# Patient Record
Sex: Female | Born: 1990 | Race: White | Hispanic: No | Marital: Single
Health system: Southern US, Community
[De-identification: ages and names within clinical notes are randomized; demographics above are authoritative.]

---

## 2019-10-08 ENCOUNTER — Emergency Department
Admission: EM | Admit: 2019-10-08 | Discharge: 2019-10-08 | Disposition: A | Discharge status: Home / Self Care | Payer: 59 | Attending: Emergency Medicine | Admitting: Emergency Medicine

## 2019-10-08 ENCOUNTER — Emergency Department: Payer: 59

## 2019-10-08 ENCOUNTER — Other Ambulatory Visit: Payer: Self-pay

## 2019-10-08 DIAGNOSIS — W07XXXA Fall from chair, initial encounter: Secondary | ICD-10-CM | POA: Diagnosis not present

## 2019-10-08 DIAGNOSIS — Y92009 Unspecified place in unspecified non-institutional (private) residence as the place of occurrence of the external cause: Secondary | ICD-10-CM | POA: Diagnosis not present

## 2019-10-08 DIAGNOSIS — S0083XA Contusion of other part of head, initial encounter: Secondary | ICD-10-CM | POA: Diagnosis not present

## 2019-10-08 DIAGNOSIS — Y999 Unspecified external cause status: Secondary | ICD-10-CM | POA: Insufficient documentation

## 2019-10-08 DIAGNOSIS — Y939 Activity, unspecified: Secondary | ICD-10-CM | POA: Insufficient documentation

## 2019-10-08 DIAGNOSIS — F10920 Alcohol use, unspecified with intoxication, uncomplicated: Secondary | ICD-10-CM | POA: Diagnosis not present

## 2019-10-08 DIAGNOSIS — S0990XA Unspecified injury of head, initial encounter: Secondary | ICD-10-CM | POA: Diagnosis present

## 2019-10-08 LAB — BASIC METABOLIC PANEL
Anion gap: 12 (ref 5–15)
BUN: 10 mg/dL (ref 6–20)
CO2: 20 mmol/L — ABNORMAL LOW (ref 22–32)
Calcium: 8.8 mg/dL — ABNORMAL LOW (ref 8.9–10.3)
Chloride: 106 mmol/L (ref 98–111)
Creatinine, Ser: 0.71 mg/dL (ref 0.44–1.00)
GFR calc Af Amer: >60 mL/min (ref >60)
GFR calc non Af Amer: >60 mL/min (ref >60)
Glucose, Bld: 143 mg/dL — ABNORMAL HIGH (ref 70–99)
Potassium: 2.8 mmol/L — ABNORMAL LOW (ref 3.5–5.1)
Sodium: 138 mmol/L (ref 135–145)

## 2019-10-08 LAB — URINALYSIS, COMPLETE (UACMP) WITH MICROSCOPIC
Bacteria, UA: NONE SEEN
Bilirubin Urine: NEGATIVE
Glucose, UA: NEGATIVE mg/dL
Ketones, ur: NEGATIVE mg/dL
Leukocytes,Ua: NEGATIVE
Nitrite: NEGATIVE
Protein, ur: NEGATIVE mg/dL
Specific Gravity, Urine: 1.001 — ABNORMAL LOW (ref 1.005–1.030)
Squamous Epithelial / HPF: NONE SEEN (ref 0–5)
WBC, UA: NONE SEEN WBC/hpf (ref 0–5)
pH: 6 (ref 5.0–8.0)

## 2019-10-08 LAB — CBC WITH DIFFERENTIAL/PLATELET
Abs Immature Granulocytes: 0.05 10*3/uL (ref 0.00–0.07)
Basophils Absolute: 0.1 10*3/uL (ref 0.0–0.1)
Basophils Relative: 1 %
Eosinophils Absolute: 0.4 10*3/uL (ref 0.0–0.5)
Eosinophils Relative: 3 %
HCT: 39.3 % (ref 36.0–46.0)
Hemoglobin: 13.5 g/dL (ref 12.0–15.0)
Immature Granulocytes: 0 %
Lymphocytes Relative: 41 %
Lymphs Abs: 5.6 10*3/uL — ABNORMAL HIGH (ref 0.7–4.0)
MCH: 31.8 pg (ref 26.0–34.0)
MCHC: 34.4 g/dL (ref 30.0–36.0)
MCV: 92.7 fL (ref 80.0–100.0)
Monocytes Absolute: 1.3 10*3/uL — ABNORMAL HIGH (ref 0.1–1.0)
Monocytes Relative: 10 %
Neutro Abs: 6.2 10*3/uL (ref 1.7–7.7)
Neutrophils Relative %: 45 %
Platelets: 294 10*3/uL (ref 150–400)
RBC: 4.24 MIL/uL (ref 3.87–5.11)
RDW: 11.8 % (ref 11.5–15.5)
Smear Review: NORMAL
WBC Morphology: >20
WBC: 13.7 10*3/uL — ABNORMAL HIGH (ref 4.0–10.5)
nRBC: 0 % (ref 0.0–0.2)

## 2019-10-08 LAB — PREGNANCY, URINE: Preg Test, Ur: NEGATIVE

## 2019-10-08 MED ORDER — POTASSIUM CHLORIDE 20 MEQ PO PACK
40.0000 meq | PACK | Freq: Once | ORAL | Status: AC
  Administered 2019-10-08: 21:00:00 40 meq via ORAL
  Filled 2019-10-08: qty 2

## 2019-10-08 MED ORDER — METOCLOPRAMIDE HCL 5 MG/ML IJ SOLN
10.0000 mg | Freq: Once | INTRAMUSCULAR | Status: AC
  Administered 2019-10-08: 20:00:00 10 mg via INTRAVENOUS
  Filled 2019-10-08: qty 2

## 2019-10-08 MED ORDER — ONDANSETRON HCL 4 MG/2ML IJ SOLN
INTRAMUSCULAR | Status: AC
  Administered 2019-10-08: 20:00:00 4 mg via INTRAVENOUS
  Filled 2019-10-08: qty 2

## 2019-10-08 MED ORDER — ONDANSETRON HCL 4 MG/2ML IJ SOLN: 4.0000 mg | Freq: Once | INTRAMUSCULAR | Status: AC

## 2019-10-08 MED ORDER — SODIUM CHLORIDE 0.9 % IV BOLUS
1000.0000 mL | Freq: Once | INTRAVENOUS | Status: AC
  Administered 2019-10-08: 1000 mL via INTRAVENOUS

## 2019-10-08 NOTE — ED Provider Notes (Signed)
[facility] Emergency Department Provider Note  ____________________________________________  Time seen: Approximately 10:33 PM  I have reviewed the triage vital signs and the nursing notes.   HISTORY  Chief Complaint Fall and Laceration    Level 5 Caveat: Portions of the History and Physical including HPI and review of systems are unable to be completely obtained due to alcohol intoxication. Additional history obtained from husband  HPI Breanna Bell is a 29 y.o. female with no significant past medical history who was in her usual state of health, having a few drinks this afternoon when she inadvertently fell from her chair and hit her face on a concrete floor.  No reported loss of consciousness.  Complains of headache.   Husband was there at the time and witnessed this episode, reports alcohol intake was not excessive.  Past medical history noncontributory   There are no problems to display for this patient.       Prior to Admission medications   Medication Sig Start Date End Date Taking? Authorizing Provider  Drospirenone-Ethinyl Estradiol-Levomefol (BEYAZ) 3-0.02-0.451 MG tablet Take 1 tablet by mouth as directed. 09/01/19  Yes [provider]     Allergies Patient has no known allergies.   No family history on file.  Social History Social History   Tobacco Use  . Smoking status: Not on file  Substance Use Topics  . Alcohol use: Not on file  . Drug use: Not on file    Review of Systems Level 5 Caveat: Portions of the History and Physical including HPI and review of systems are unable to be completely obtained due to patient being a poor historian   Constitutional:   No known fever.  ENT:   No rhinorrhea. Cardiovascular:   No chest pain or syncope. Respiratory:   No dyspnea or cough. Gastrointestinal:   Negative for abdominal pain, vomiting and diarrhea.  Musculoskeletal:   Negative for focal pain or  swelling ____________________________________________   PHYSICAL EXAM:  VITAL SIGNS: ED Triage Vitals  Enc Vitals Group     BP 10/08/19 1930 129/89     Pulse Rate 10/08/19 1930 (!) 135     Resp 10/08/19 1931 18     Temp 10/08/19 1931 97.9 F (36.6 C)     Temp Source 10/08/19 1931 Axillary     SpO2 10/08/19 1930 98 %     Weight 10/08/19 1917 120 lb (54.4 kg)     Height 10/08/19 1917 5\' 3"  (1.6 m)     Head Circumference --      Peak Flow --      Pain Score --      Pain Loc --      Pain Edu? --      Excl. in [place]? --     Vital signs reviewed, nursing assessments reviewed.   Constitutional:   Alert and oriented to self. Non-toxic appearance. Eyes:   Conjunctivae are normal. EOMI. PERRL.  No nystagmus ENT      Head:   Normocephalic with dried blood at bilateral nostrils.  There is swelling over the supraorbital ridge without laceration.  No otorrhea.  No facial bony tenderness or crepitus or step-off      Nose:   No congestion/rhinnorhea.  No epistaxis but there is dried blood at the nares.      Mouth/Throat:   MMM, no pharyngeal erythema. No peritonsillar mass.  No intraoral injury      Neck:   No meningismus. Full ROM.  No midline  spinal tenderness Hematological/Lymphatic/Immunilogical:   No cervical lymphadenopathy. Cardiovascular:   Tachycardia heart rate 130. Symmetric bilateral radial and DP pulses.  No murmurs. Cap refill less than 2 seconds. Respiratory:   Normal respiratory effort without tachypnea/retractions. Breath sounds are clear and equal bilaterally. No wheezes/rales/rhonchi. Gastrointestinal:   Soft and nontender. Non distended. There is no CVA tenderness.  No rebound, rigidity, or guarding. Musculoskeletal:   Normal range of motion in all extremities. No joint effusions.  No lower extremity tenderness.  No edema. Neurologic:   Slurred speech, anxious, asking if her baby is okay Motor grossly intact.  Skin:    Skin is warm, dry and intact. No rash noted.  No  petechiae, purpura, or bullae.  ____________________________________________    LABS (pertinent positives/negatives) (all labs ordered are listed, but only abnormal results are displayed) Labs Reviewed  URINALYSIS, COMPLETE (UACMP) WITH MICROSCOPIC - Abnormal; Notable for the following components:      Result Value   Color, Urine COLORLESS (*)    APPearance CLEAR (*)    Specific Gravity, Urine 1.001 (*)    Hgb urine dipstick SMALL (*)    All other components within normal limits  BASIC METABOLIC PANEL - Abnormal; Notable for the following components:   Potassium 2.8 (*)    CO2 20 (*)    Glucose, Bld 143 (*)    Calcium 8.8 (*)    All other components within normal limits  CBC WITH DIFFERENTIAL/PLATELET - Abnormal; Notable for the following components:   WBC 13.7 (*)    Lymphs Abs 5.6 (*)    Monocytes Absolute 1.3 (*)    All other components within normal limits  PREGNANCY, URINE  PATHOLOGIST SMEAR REVIEW   ____________________________________________   EKG  Interpreted by me Sinus tachycardia rate 138.  Normal axis and intervals.  Normal QRS ST segments and T waves.  ____________________________________________    RADIOLOGY  CT Head Wo Contrast  Result Date: 10/08/2019 CLINICAL DATA:  Right scalp laceration, right periorbital soft tissue swelling EXAM: CT HEAD WITHOUT CONTRAST CT CERVICAL SPINE WITHOUT CONTRAST TECHNIQUE: Multidetector CT imaging of the head and cervical spine was performed following the standard protocol without intravenous contrast. Multiplanar CT image reconstructions of the cervical spine were also generated. COMPARISON:  None. FINDINGS: CT HEAD FINDINGS Brain: No evidence of acute infarction, hemorrhage, hydrocephalus, extra-axial collection or mass lesion/mass effect. Vascular: No hyperdense vessel or unexpected calcification. Skull: Right frontal and periorbital soft tissue swelling and thin crescentic hematoma measuring up to 7 mm in maximal  thickness. No calvarial fracture or visible fracture of the included facial bones. Sinuses/Orbits: Extensive right periorbital soft tissue swelling. No retro septal gas or hemorrhage is visualized in the right orbit. Globes appear normal and symmetric. The lenses appear orthotopic. Paranasal sinuses and mastoid air cells are predominantly clear. Other: None CT CERVICAL SPINE FINDINGS Alignment: Preservation of the normal cervical lordosis without traumatic listhesis. Mild leftward flexion of the neck. No abnormal facet widening. Normal alignment of the craniocervical and atlantoaxial articulations. Skull base and vertebrae: No acute fracture. No primary bone lesion or focal pathologic process. Soft tissues and spinal canal: No pre or paravertebral fluid or swelling. No visible canal hematoma. Disc levels: No significant central canal or foraminal stenosis identified within the imaged levels of the spine. Upper chest: No acute abnormality in the upper chest or imaged lung apices. Other: Normal thyroid. IMPRESSION: 1. No acute intracranial findings. 2. Extensive right frontal and periorbital soft tissue swelling. No calvarial fracture or fracture of  the included facial bones. No evidence of retro septal gas or hemorrhage in the right orbit. No CT evident globe injury. 3. No evidence of acute fracture or listhesis of the cervical spine. 4. Mild leftward flexion of the neck, may be positional or related to muscle spasm. Electronically Signed   By: Kreg Shropshire M.D.   On: 10/08/2019 20:53   CT CERVICAL SPINE WO CONTRAST  Result Date: 10/08/2019 CLINICAL DATA:  Right scalp laceration, right periorbital soft tissue swelling EXAM: CT HEAD WITHOUT CONTRAST CT CERVICAL SPINE WITHOUT CONTRAST TECHNIQUE: Multidetector CT imaging of the head and cervical spine was performed following the standard protocol without intravenous contrast. Multiplanar CT image reconstructions of the cervical spine were also generated. COMPARISON:   None. FINDINGS: CT HEAD FINDINGS Brain: No evidence of acute infarction, hemorrhage, hydrocephalus, extra-axial collection or mass lesion/mass effect. Vascular: No hyperdense vessel or unexpected calcification. Skull: Right frontal and periorbital soft tissue swelling and thin crescentic hematoma measuring up to 7 mm in maximal thickness. No calvarial fracture or visible fracture of the included facial bones. Sinuses/Orbits: Extensive right periorbital soft tissue swelling. No retro septal gas or hemorrhage is visualized in the right orbit. Globes appear normal and symmetric. The lenses appear orthotopic. Paranasal sinuses and mastoid air cells are predominantly clear. Other: None CT CERVICAL SPINE FINDINGS Alignment: Preservation of the normal cervical lordosis without traumatic listhesis. Mild leftward flexion of the neck. No abnormal facet widening. Normal alignment of the craniocervical and atlantoaxial articulations. Skull base and vertebrae: No acute fracture. No primary bone lesion or focal pathologic process. Soft tissues and spinal canal: No pre or paravertebral fluid or swelling. No visible canal hematoma. Disc levels: No significant central canal or foraminal stenosis identified within the imaged levels of the spine. Upper chest: No acute abnormality in the upper chest or imaged lung apices. Other: Normal thyroid. IMPRESSION: 1. No acute intracranial findings. 2. Extensive right frontal and periorbital soft tissue swelling. No calvarial fracture or fracture of the included facial bones. No evidence of retro septal gas or hemorrhage in the right orbit. No CT evident globe injury. 3. No evidence of acute fracture or listhesis of the cervical spine. 4. Mild leftward flexion of the neck, may be positional or related to muscle spasm. Electronically Signed   By: Kreg Shropshire M.D.   On: 10/08/2019 20:53     ____________________________________________   PROCEDURES Procedures  ____________________________________________  DIFFERENTIAL DIAGNOSIS   Intracranial hemorrhage, C-spine fracture, alcohol intoxication, dehydration  CLINICAL IMPRESSION / ASSESSMENT AND PLAN / ED COURSE  Medications ordered in the ED: Medications  ondansetron (ZOFRAN) injection 4 mg (4 mg Intravenous Given 10/08/19 1946)  metoCLOPramide (REGLAN) injection 10 mg (10 mg Intravenous Given 10/08/19 2000)  sodium chloride 0.9 % bolus 1,000 mL (0 mLs Intravenous Stopped 10/08/19 2232)  potassium chloride (KLOR-CON) packet 40 mEq (40 mEq Oral Given 10/08/19 2114)    Pertinent labs & imaging results that were available during my care of the patient were reviewed by me and considered in my medical decision making (see chart for details).   Gloristine Turrubiates was evaluated in Emergency Department on 10/08/2019 for the symptoms described in the history of present illness. She was evaluated in the context of the global COVID-19 pandemic, which necessitated consideration that the patient might be at risk for infection with the SARS-CoV-2 virus that causes COVID-19. Institutional protocols and algorithms that pertain to the evaluation of patients at risk for COVID-19 are in a state of rapid change based on  information released by regulatory bodies including the CDC and federal and state organizations. These policies and algorithms were followed during the patient's care in the ED.   Patient presents with facial trauma after a fall in the setting of alcohol intoxication.  Discussed with her spouse observing her until sober and reassessing symptoms and neurologic exam versus obtaining CT imaging early and being able to discharge home more quickly after.  He is in favor of obtaining CT imaging for more definitive early evaluation.  I will get a CT scan of the head and neck.  No evidence of facial bone fracture.  I will check labs given her  pronounced tachycardia and give IV fluids for hydration.  Clinical Course as of Oct 08 2231  Sat Oct 08, 2019  2109 CT head/neck unremarkable. HR improved to 110 so far with IVF. Pt stable for DC home with spouse. Will PO trial, give oral potassium replacement    [PS]    Clinical Course User Index [PS] Sharman Cheek, MD    ----------------------------------------- 10:38 PM on 10/08/2019 -----------------------------------------  Now ambulatory with steady gait, tolerating p.o., clinically sober at this time.  Stable for discharge home.   ____________________________________________   FINAL CLINICAL IMPRESSION(S) / ED DIAGNOSES    Final diagnoses:  Alcoholic intoxication without complication (HCC)  Contusion of face, initial encounter     ED Discharge Orders    None      Portions of this note were generated with dragon dictation software. Dictation errors may occur despite best attempts at proofreading.   Sharman Cheek, MD 10/08/19 2239

## 2019-10-08 NOTE — ED Notes (Signed)
Pt signed discharge papers and the papers were sent to records.

## 2019-10-08 NOTE — Discharge Instructions (Signed)
Your CT scan of the head was unremarkable. Labs showed a low potassium level, so we gave you a potassium supplement to replace this electrolyte.

## 2019-10-08 NOTE — ED Triage Notes (Signed)
Patient was sitting on couch and fell backwards hitting her head.  Patient is intoxicated, states she had a "couple" of shots.

## 2019-10-08 NOTE — ED Notes (Signed)
Pt transported to CT ?

## 2019-10-08 NOTE — ED Notes (Signed)
Pt was able to drink 8 oz of water (with potassium in it) and a few sips of ginger ale. Pt ate one cracker, states she feels nauseas but has not thrown up since.

## 2019-10-08 NOTE — ED Notes (Signed)
Pt has gash on R side of head and eye is swollen. Pt has slurred pt on assessment, can answer some questions but was not aware of where she is. Pt states she had a few shots but doesn't remember falling or what happened.

## 2019-10-10 LAB — PATHOLOGIST SMEAR REVIEW

## 2021-03-31 IMAGING — CT CT HEAD W/O CM
5 of 9 series · 17 of 47 positions shown, 18 images · non-contrast
Comparison: None.

CLINICAL DATA: Right scalp laceration, right periorbital soft
tissue swelling

EXAM:
CT HEAD WITHOUT CONTRAST
CT CERVICAL SPINE WITHOUT CONTRAST
TECHNIQUE: Multidetector CT imaging of the head and cervical spine was
performed following the standard protocol without intravenous
contrast. Multiplanar CT image reconstructions of the cervical spine
were also generated.

[Series 2: head wo · axial · 0.43mm/px · z∈[+21,+166]mm · 3 of 30 slices shown, 4 images]
[im 1/30  brain]
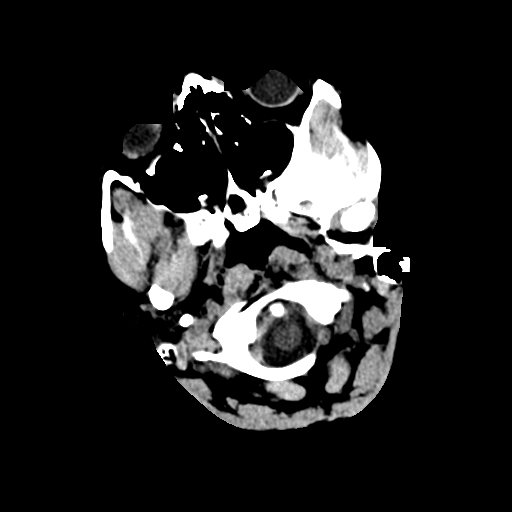
[im 1/30  bone]
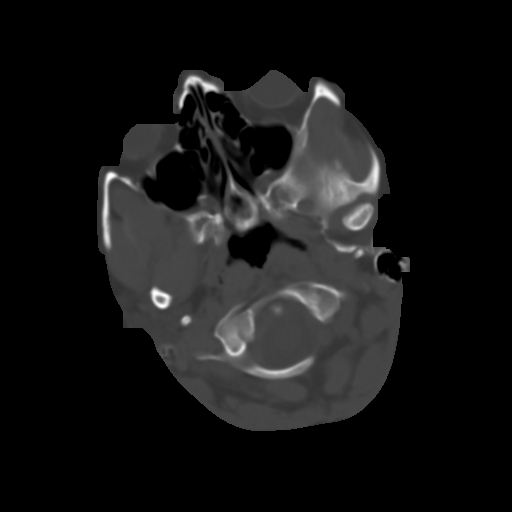
[im 15/30  brain]
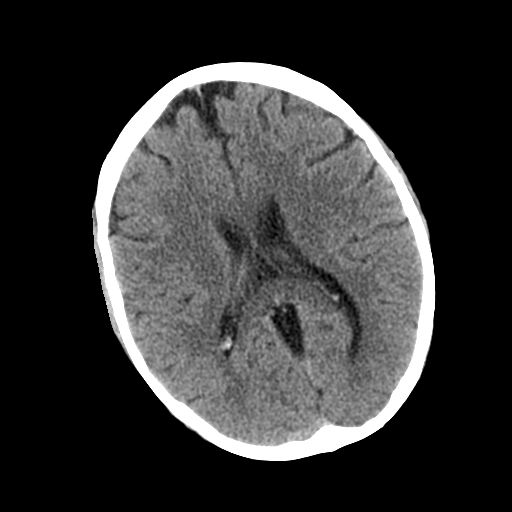
[im 30/30  brain]
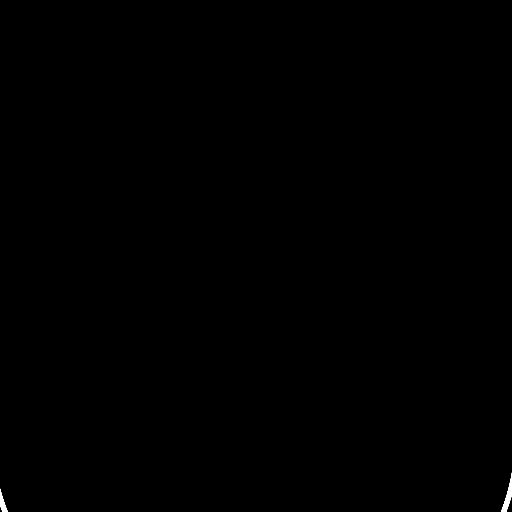

[Series 4: coronal soft tissue · coronal · 0.30mm/px · 3 of 59 slices shown]
[im 15/59  brain]
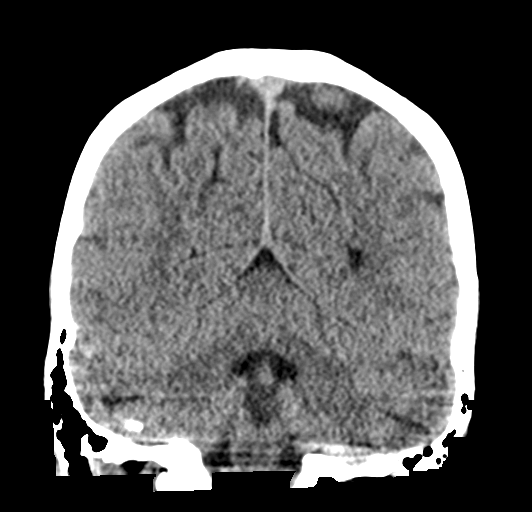
[im 30/59  brain]
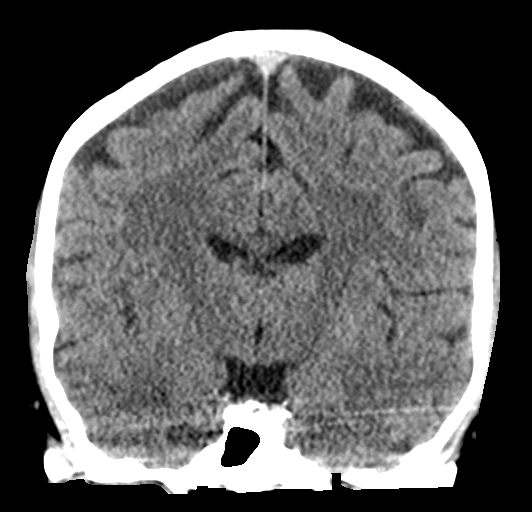
[im 44/59  brain]
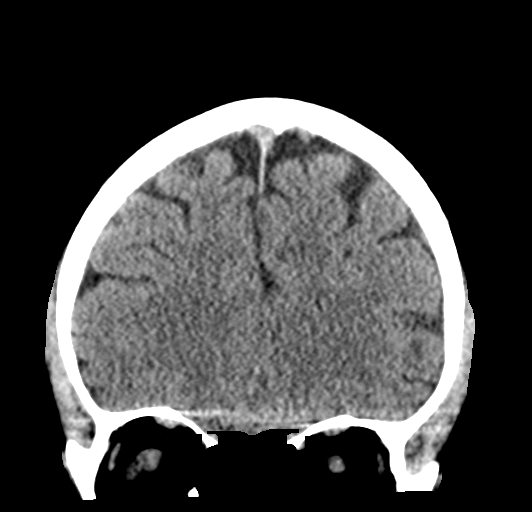

[Series 5: sagittal soft tissue · sagittal · 0.30mm/px · 1 of 46 slices shown]
[im 23/46  brain]
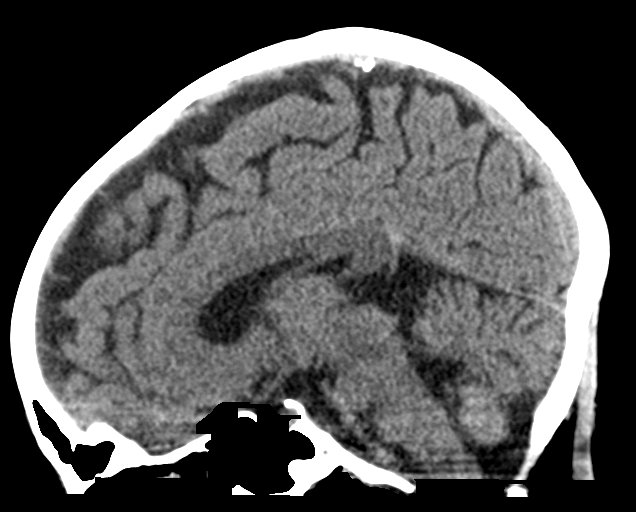

[Series 8: c spine soft · axial · 0.32mm/px · z∈[-119,-65]mm · 3 of 82 slices shown]
[im 14/82  brain]
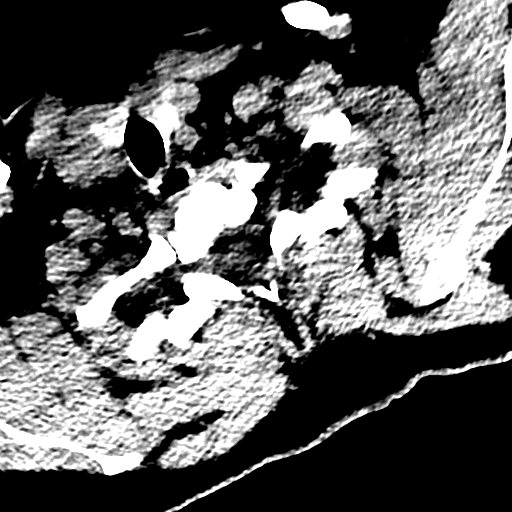
[im 28/82  brain]
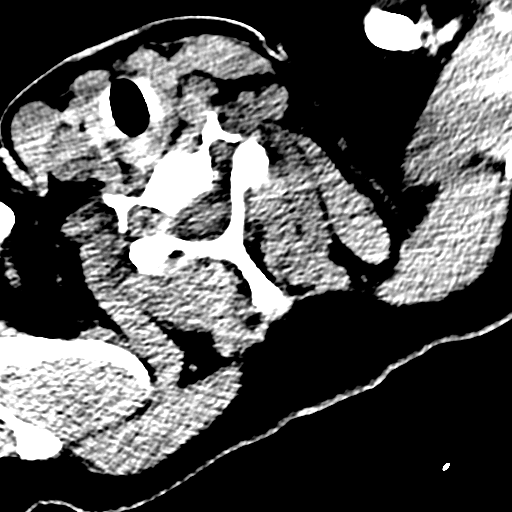
[im 41/82  brain]
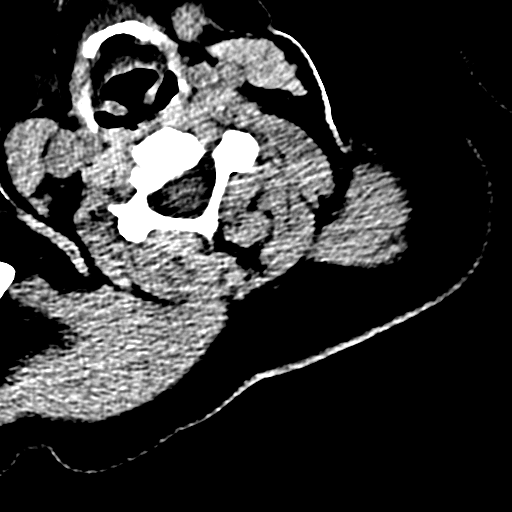

[Series 11: orthogonal bone · axial · 0.22mm/px · z∈[-158,-18]mm · 7 of 99 slices shown]
[im 13/99  bone]
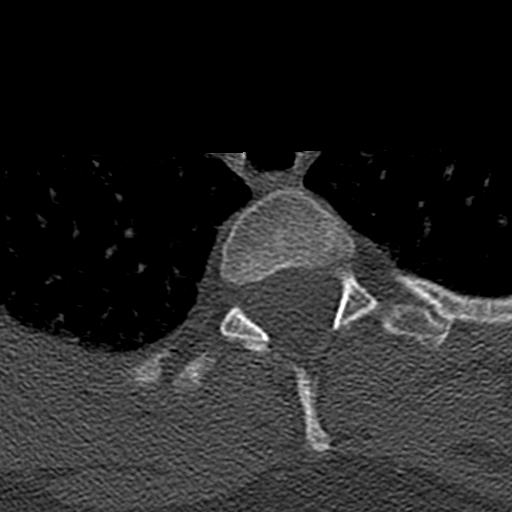
[im 25/99  bone]
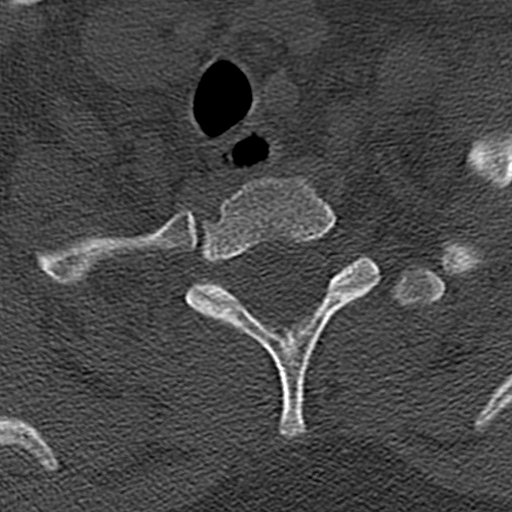
[im 37/99  bone]
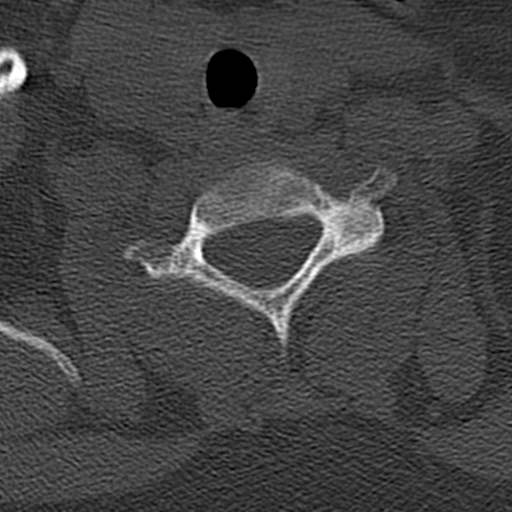
[im 50/99  bone]
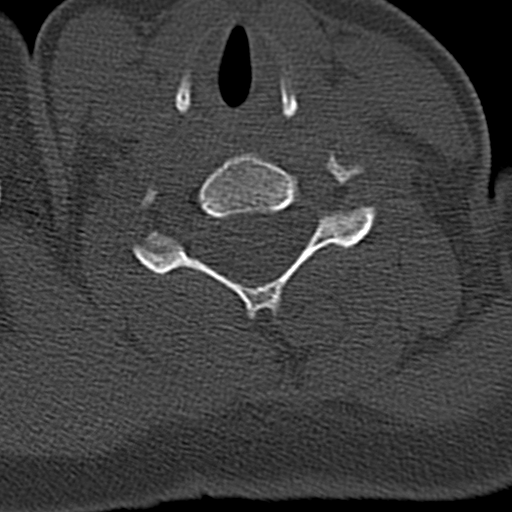
[im 62/99  bone]
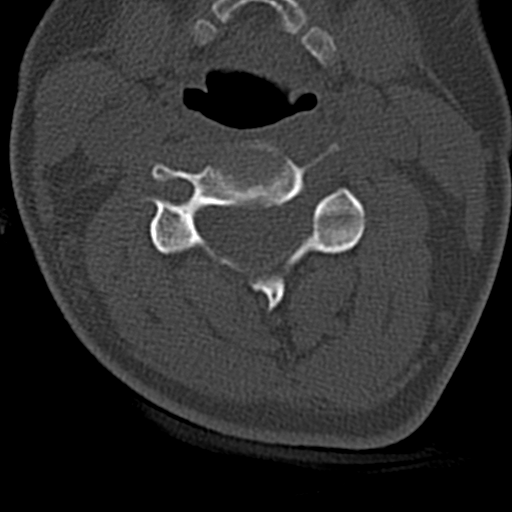
[im 74/99  bone]
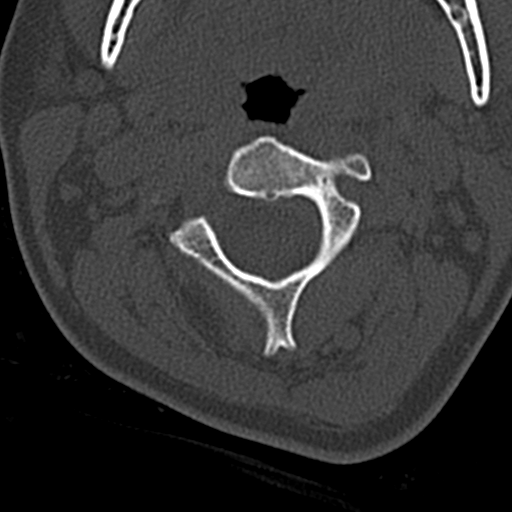
[im 86/99  bone]
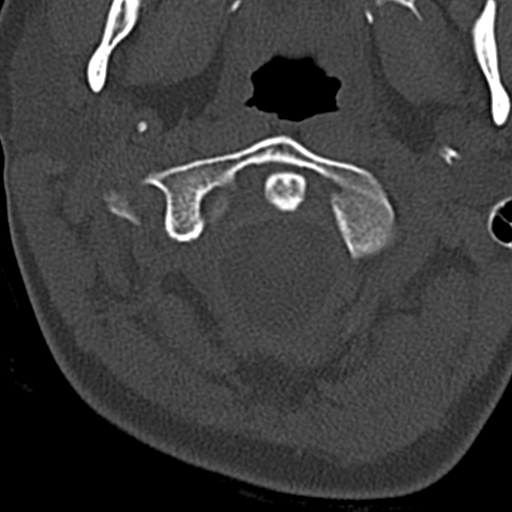

[17 of 47 positions shown; findings below may reference images not displayed]

FINDINGS: CT HEAD FINDINGS

Brain: No evidence of acute infarction, hemorrhage, hydrocephalus,
extra-axial collection or mass lesion/mass effect.

Vascular: No hyperdense vessel or unexpected calcification.

Skull: Right frontal and periorbital soft tissue swelling and thin
crescentic hematoma measuring up to 7 mm in maximal thickness. No
calvarial fracture or visible fracture of the included facial bones.

Sinuses/Orbits: Extensive right periorbital soft tissue swelling. No
retro septal gas or hemorrhage is visualized in the right orbit.
Globes appear normal and symmetric. The lenses appear orthotopic.
Paranasal sinuses and mastoid air cells are predominantly clear.

Other: None

CT CERVICAL SPINE FINDINGS

Alignment: Preservation of the normal cervical lordosis without
traumatic listhesis. Mild leftward flexion of the neck. No abnormal
facet widening. Normal alignment of the craniocervical and
atlantoaxial articulations.

Skull base and vertebrae: No acute fracture. No primary bone lesion
or focal pathologic process.

Soft tissues and spinal canal: No pre or paravertebral fluid or
swelling. No visible canal hematoma.

Disc levels: No significant central canal or foraminal stenosis
identified within the imaged levels of the spine.

Upper chest: No acute abnormality in the upper chest or imaged lung
apices.

Other: Normal thyroid.
IMPRESSION: 1. No acute intracranial findings.
2. Extensive right frontal and periorbital soft tissue swelling. No
calvarial fracture or fracture of the included facial bones. No
evidence of retro septal gas or hemorrhage in the right orbit. No CT
evident globe injury.
3. No evidence of acute fracture or listhesis of the cervical spine.
4. Mild leftward flexion of the neck, may be positional or related
to muscle spasm.
